# Patient Record
Sex: Female | Born: 1955 | Race: White | Hispanic: No | Smoking: Current every day smoker
Health system: Southern US, Community
[De-identification: ages and names within clinical notes are randomized; demographics above are authoritative.]

## PROBLEM LIST (undated history)

## (undated) DIAGNOSIS — I509 Heart failure, unspecified: Secondary | ICD-10-CM

## (undated) DIAGNOSIS — G8929 Other chronic pain: Secondary | ICD-10-CM

## (undated) DIAGNOSIS — M549 Dorsalgia, unspecified: Secondary | ICD-10-CM

## (undated) DIAGNOSIS — C801 Malignant (primary) neoplasm, unspecified: Secondary | ICD-10-CM

## (undated) DIAGNOSIS — R001 Bradycardia, unspecified: Secondary | ICD-10-CM

## (undated) DIAGNOSIS — F419 Anxiety disorder, unspecified: Secondary | ICD-10-CM

## (undated) DIAGNOSIS — R569 Unspecified convulsions: Secondary | ICD-10-CM

## (undated) DIAGNOSIS — E079 Disorder of thyroid, unspecified: Secondary | ICD-10-CM

## (undated) HISTORY — PX: BACK SURGERY: SHX140

## (undated) HISTORY — PX: MASTECTOMY: SHX3

## (undated) HISTORY — PX: ABDOMINAL HYSTERECTOMY: SHX81

---

## 2013-12-07 ENCOUNTER — Emergency Department (HOSPITAL_COMMUNITY): Payer: Medicare PPO

## 2013-12-07 ENCOUNTER — Observation Stay (HOSPITAL_COMMUNITY)
Admission: EM | Admit: 2013-12-07 | Discharge: 2013-12-09 | Disposition: A | Discharge status: Home / Self Care | Payer: Medicare PPO | Attending: Internal Medicine | Admitting: Internal Medicine

## 2013-12-07 ENCOUNTER — Encounter (HOSPITAL_COMMUNITY): Payer: Self-pay | Admitting: Emergency Medicine

## 2013-12-07 DIAGNOSIS — E876 Hypokalemia: Secondary | ICD-10-CM | POA: Insufficient documentation

## 2013-12-07 DIAGNOSIS — F419 Anxiety disorder, unspecified: Secondary | ICD-10-CM | POA: Diagnosis present

## 2013-12-07 DIAGNOSIS — G894 Chronic pain syndrome: Secondary | ICD-10-CM | POA: Insufficient documentation

## 2013-12-07 DIAGNOSIS — R569 Unspecified convulsions: Secondary | ICD-10-CM | POA: Diagnosis present

## 2013-12-07 DIAGNOSIS — D72829 Elevated white blood cell count, unspecified: Secondary | ICD-10-CM | POA: Insufficient documentation

## 2013-12-07 DIAGNOSIS — G9340 Encephalopathy, unspecified: Secondary | ICD-10-CM | POA: Diagnosis present

## 2013-12-07 DIAGNOSIS — F29 Unspecified psychosis not due to a substance or known physiological condition: Secondary | ICD-10-CM | POA: Insufficient documentation

## 2013-12-07 DIAGNOSIS — I498 Other specified cardiac arrhythmias: Secondary | ICD-10-CM | POA: Insufficient documentation

## 2013-12-07 DIAGNOSIS — Z9010 Acquired absence of unspecified breast and nipple: Secondary | ICD-10-CM | POA: Insufficient documentation

## 2013-12-07 DIAGNOSIS — R001 Bradycardia, unspecified: Secondary | ICD-10-CM | POA: Diagnosis present

## 2013-12-07 DIAGNOSIS — Z79899 Other long term (current) drug therapy: Secondary | ICD-10-CM | POA: Insufficient documentation

## 2013-12-07 DIAGNOSIS — Z853 Personal history of malignant neoplasm of breast: Secondary | ICD-10-CM

## 2013-12-07 DIAGNOSIS — I509 Heart failure, unspecified: Secondary | ICD-10-CM | POA: Insufficient documentation

## 2013-12-07 DIAGNOSIS — M549 Dorsalgia, unspecified: Secondary | ICD-10-CM | POA: Insufficient documentation

## 2013-12-07 DIAGNOSIS — Z23 Encounter for immunization: Secondary | ICD-10-CM | POA: Insufficient documentation

## 2013-12-07 DIAGNOSIS — F411 Generalized anxiety disorder: Secondary | ICD-10-CM | POA: Insufficient documentation

## 2013-12-07 DIAGNOSIS — E079 Disorder of thyroid, unspecified: Secondary | ICD-10-CM | POA: Insufficient documentation

## 2013-12-07 DIAGNOSIS — F17200 Nicotine dependence, unspecified, uncomplicated: Secondary | ICD-10-CM | POA: Insufficient documentation

## 2013-12-07 DIAGNOSIS — G8929 Other chronic pain: Secondary | ICD-10-CM | POA: Diagnosis present

## 2013-12-07 DIAGNOSIS — R4182 Altered mental status, unspecified: Principal | ICD-10-CM | POA: Insufficient documentation

## 2013-12-07 HISTORY — DX: Disorder of thyroid, unspecified: E07.9

## 2013-12-07 HISTORY — DX: Anxiety disorder, unspecified: F41.9

## 2013-12-07 HISTORY — DX: Unspecified convulsions: R56.9

## 2013-12-07 HISTORY — DX: Other chronic pain: G89.29

## 2013-12-07 HISTORY — DX: Heart failure, unspecified: I50.9

## 2013-12-07 HISTORY — DX: Bradycardia, unspecified: R00.1

## 2013-12-07 HISTORY — DX: Malignant (primary) neoplasm, unspecified: C80.1

## 2013-12-07 HISTORY — DX: Dorsalgia, unspecified: M54.9

## 2013-12-07 LAB — RAPID URINE DRUG SCREEN, HOSP PERFORMED
Amphetamines: NOT DETECTED
Barbiturates: NOT DETECTED
Benzodiazepines: NOT DETECTED
COCAINE: NOT DETECTED
Opiates: NOT DETECTED
Tetrahydrocannabinol: NOT DETECTED

## 2013-12-07 LAB — CBC WITH DIFFERENTIAL/PLATELET
Basophils Absolute: 0 10*3/uL (ref 0.0–0.1)
Basophils Relative: 0 % (ref 0–1)
EOS ABS: 0 10*3/uL (ref 0.0–0.7)
Eosinophils Relative: 0 % (ref 0–5)
HEMATOCRIT: 44.6 % (ref 36.0–46.0)
Hemoglobin: 14.8 g/dL (ref 12.0–15.0)
Lymphocytes Relative: 19 % (ref 12–46)
Lymphs Abs: 2.1 10*3/uL (ref 0.7–4.0)
MCH: 27.7 pg (ref 26.0–34.0)
MCHC: 33.2 g/dL (ref 30.0–36.0)
MCV: 83.5 fL (ref 78.0–100.0)
Monocytes Absolute: 0.8 10*3/uL (ref 0.1–1.0)
Monocytes Relative: 7 % (ref 3–12)
Neutro Abs: 7.8 10*3/uL — ABNORMAL HIGH (ref 1.7–7.7)
Neutrophils Relative %: 73 % (ref 43–77)
Platelets: 380 10*3/uL (ref 150–400)
RBC: 5.34 MIL/uL — AB (ref 3.87–5.11)
RDW: 14.8 % (ref 11.5–15.5)
WBC: 10.7 10*3/uL — ABNORMAL HIGH (ref 4.0–10.5)

## 2013-12-07 LAB — URINALYSIS, ROUTINE W REFLEX MICROSCOPIC
Bilirubin Urine: NEGATIVE
GLUCOSE, UA: NEGATIVE mg/dL
Hgb urine dipstick: NEGATIVE
Ketones, ur: 15 mg/dL — AB
LEUKOCYTES UA: NEGATIVE
NITRITE: NEGATIVE
PH: 6 (ref 5.0–8.0)
PROTEIN: NEGATIVE mg/dL
Specific Gravity, Urine: 1.025 (ref 1.005–1.030)
Urobilinogen, UA: 1 mg/dL (ref 0.0–1.0)

## 2013-12-07 LAB — BASIC METABOLIC PANEL
BUN: 12 mg/dL (ref 6–23)
CO2: 22 meq/L (ref 19–32)
Calcium: 9.8 mg/dL (ref 8.4–10.5)
Chloride: 102 mEq/L (ref 96–112)
Creatinine, Ser: 0.77 mg/dL (ref 0.50–1.10)
GFR calc Af Amer: >90 mL/min (ref >90)
Glucose, Bld: 109 mg/dL — ABNORMAL HIGH (ref 70–99)
POTASSIUM: 3.7 meq/L (ref 3.7–5.3)
Sodium: 142 mEq/L (ref 137–147)

## 2013-12-07 LAB — TROPONIN I: Troponin I: <0.3 ng/mL (ref <0.30)

## 2013-12-07 NOTE — ED Provider Notes (Signed)
CSN: [id number]     Arrival date & time 12/07/13  1750 History  This chart was scribed for Jamie Greek, MD by Jamie Nunez, ED Scribe. This patient was seen in room APA05/APA05 and the patient's care was started at 5:58 PM.  Chief Complaint  Patient presents with  . Altered Mental Status    The history is provided by the patient. No language interpreter was used.    HPI Comments: Jamie Nunez is a 58 y.o. Female brought by EMS to the Emergency Department complaining of mild confusion currently after an episode of witnessed seizure-like activity that occurred PTA. Husband states that pt suddenly began feeling confused, and "not feeling well" in general while they were on a short car trip PTA. Husband reports that pt then began having very brief "head jerking episodes" intermittently for about 5 minutes. Husband reports that pt's eyes rolled back in her head and that her mouth "looked funny" at this time. Husband believes this may have been seizure-like activity. Pt is repeatedly stating "I don't know what happened" and I just know I didn't feel well". Pt states that she has no prior history of similar symptoms. Husband states that he believes pt had decreased responsiveness during this episode today. Pt denies LOC, feeling lightheaded or any other symptoms.    Past Medical History  Diagnosis Date  . CHF (congestive heart failure)   . Cancer     left breast  . Thyroid disease    Past Surgical History  Procedure Laterality Date  . Back surgery    . Mastectomy Left   . Abdominal hysterectomy     History reviewed. No pertinent family history. History  Substance Use Topics  . Smoking status: Current Every Day Smoker  . Smokeless tobacco: Not on file  . Alcohol Use: No   OB History   Grav Para Term Preterm Abortions TAB SAB Ect Mult Living                 Review of Systems  Neurological: Negative for syncope and light-headedness.       Possible seizure   Psychiatric/Behavioral: Positive for confusion.  All other systems reviewed and are negative.   Allergies  Review of patient's allergies indicates no known allergies.  Home Medications   Current Outpatient Rx  Name  Route  Sig  Dispense  Refill  . ALPRAZolam (XANAX) 0.5 MG tablet   Oral   Take 0.5 mg by mouth 2 (two) times daily as needed.         . furosemide (LASIX) 20 MG tablet   Oral   Take 10 mg by mouth 3 (three) times daily.         . methadone (DOLOPHINE) 10 MG tablet   Oral   Take 10 mg by mouth 3 (three) times daily.         Marland Kitchen zolpidem (AMBIEN) 10 MG tablet   Oral   Take 10 mg by mouth at bedtime as needed.           Triage Vitals: BP 146/63  Pulse 112  Temp(Src) 99.3 F (37.4 C) (Oral)  Resp 20  Ht 5\' 1"  (1.549 m)  Wt 156 lb (70.761 kg)  BMI 29.49 kg/m2  SpO2 93%  Physical Exam  Constitutional: She is oriented to person, place, and time. She appears well-developed and well-nourished. No distress.  HENT:  Head: Normocephalic and atraumatic.  Right Ear: Hearing normal.  Left Ear: Hearing normal.  Nose: Nose  normal.  Mouth/Throat: Oropharynx is clear and moist and mucous membranes are normal.  Eyes: Conjunctivae and EOM are normal. Pupils are equal, round, and reactive to light.  Neck: Normal range of motion. Neck supple.  Cardiovascular: Regular rhythm, S1 normal and S2 normal.  Exam reveals no gallop and no friction rub.   No murmur heard. Pulmonary/Chest: Effort normal and breath sounds normal. No respiratory distress. She exhibits no tenderness.  Abdominal: Soft. Normal appearance and bowel sounds are normal. There is no hepatosplenomegaly. There is no tenderness. There is no rebound, no guarding, no tenderness at McBurney's point and negative Murphy's sign. No hernia.  Musculoskeletal: Normal range of motion.  Neurological: She is alert and oriented to person, place, and time. She has normal strength. No cranial nerve deficit or sensory  deficit. Coordination normal. GCS eye subscore is 4. GCS verbal subscore is 5. GCS motor subscore is 6.  Slow to respond to questions and was unable to answer some questions by her significant other.  Skin: Skin is warm, dry and intact. No rash noted. No cyanosis.  Psychiatric: She has a normal mood and affect. Her speech is normal and behavior is normal. Thought content normal.    ED Course  Procedures (including critical care time)  DIAGNOSTIC STUDIES: Oxygen Saturation is 93% on RA, normal by my interpretation.    COORDINATION OF CARE: 6:05 PM- Discussed plan to obtain a CT of pt's head, as well as diagnostic lab work. Pt advised of plan for treatment and pt agrees.  Labs Review Labs Reviewed  CBC WITH DIFFERENTIAL - Abnormal; Notable for the following:    WBC 10.7 (*)    RBC 5.34 (*)    Neutro Abs 7.8 (*)    All other components within normal limits  BASIC METABOLIC PANEL - Abnormal; Notable for the following:    Glucose, Bld 109 (*)    All other components within normal limits  URINALYSIS, ROUTINE W REFLEX MICROSCOPIC - Abnormal; Notable for the following:    Ketones, ur 15 (*)    All other components within normal limits  TROPONIN I   Imaging Review Ct Head Wo Contrast  12/07/2013   CLINICAL DATA:  Confusion.  Questionable seizure activity.  EXAM: CT HEAD WITHOUT CONTRAST  TECHNIQUE: Contiguous axial images were obtained from the base of the skull through the vertex without intravenous contrast.  COMPARISON:  None.  FINDINGS: Slight motion degradation.  No intracranial hemorrhage.  No CT evidence of large acute infarct.  No intracranial mass lesion noted on this unenhanced exam. If further delineation for seizure focus or small acute infarct is clinically desired, MR may be considered  No hydrocephalus.  IMPRESSION: No intracranial hemorrhage or CT evidence of large acute infarct.   Electronically Signed   By: Chauncey Cruel M.D.   On: 12/07/2013 18:36    EKG Interpretation     Date/Time:  Tuesday December 07 2013 18:57:45 EST Ventricular Rate:  64 PR Interval:  176 QRS Duration: 96 QT Interval:  458 QTC Calculation: 472 R Axis:   -27 Text Interpretation:  Normal sinus rhythm Low voltage QRS Incomplete right bundle branch block Possible Inferior infarct , age undetermined Cannot rule out Anterior infarct , age undetermined Abnormal ECG No previous ECGs available Confirmed by Doretta Remmert  MD, Tarrie Mcmichen ([id number]) on 12/07/2013 7:11:40 PM            MDM  Diagnosis: Mental status changes, seizure versus TIA  Patient comes to the ER for evaluation of acute  onset mental status changes confusion. Significant other reports that she had shaking movements of her extremities when episode occurred. Initially she was responding while she was shaking her arms, but then she went completely unresponsive, continued to have shaking episodes. This is concerning for possible seizure. There are some atypical features for seizure, in that initially the shaking episodes were while she was alert and she remembers doing it. On arrival to the ER, her mentation was improved, but she was still slow to respond and unable to answer simple questions. She was initially disoriented for EMS. This could be post ictal state. I cannot rule out TIA. As the patient has never had a similar episode, will ask hospitalist to observe her overnight for seizure versus TIA.   I personally performed the services described in this documentation, which was scribed in my presence. The recorded information has been reviewed and is accurate.    Jamie Greek, MD 12/07/13 2046

## 2013-12-07 NOTE — ED Notes (Signed)
Blood pressure cuff moved to right arm. Patient had left mastectomy 2 years ago.

## 2013-12-07 NOTE — ED Notes (Signed)
Per EMS, called out for possible seizure EMS states when they arrived pt was confused and unable to say where she lived. On arrival to ED pt appeared a little confused. Pt states she and her friend were on the way to [facility] to eat. States she thought she was still in [place]

## 2013-12-08 ENCOUNTER — Observation Stay (HOSPITAL_COMMUNITY): Payer: Medicare PPO

## 2013-12-08 ENCOUNTER — Observation Stay (HOSPITAL_COMMUNITY)
Admit: 2013-12-08 | Discharge: 2013-12-08 | Disposition: A | Discharge status: Home / Self Care | Payer: Medicare PPO | Attending: Internal Medicine | Admitting: Internal Medicine

## 2013-12-08 ENCOUNTER — Encounter (HOSPITAL_COMMUNITY): Payer: Self-pay | Admitting: Internal Medicine

## 2013-12-08 DIAGNOSIS — R001 Bradycardia, unspecified: Secondary | ICD-10-CM

## 2013-12-08 DIAGNOSIS — G8929 Other chronic pain: Secondary | ICD-10-CM | POA: Diagnosis present

## 2013-12-08 DIAGNOSIS — R569 Unspecified convulsions: Secondary | ICD-10-CM

## 2013-12-08 DIAGNOSIS — F411 Generalized anxiety disorder: Secondary | ICD-10-CM

## 2013-12-08 DIAGNOSIS — M549 Dorsalgia, unspecified: Secondary | ICD-10-CM

## 2013-12-08 DIAGNOSIS — F419 Anxiety disorder, unspecified: Secondary | ICD-10-CM | POA: Diagnosis present

## 2013-12-08 DIAGNOSIS — Z853 Personal history of malignant neoplasm of breast: Secondary | ICD-10-CM

## 2013-12-08 DIAGNOSIS — I498 Other specified cardiac arrhythmias: Secondary | ICD-10-CM

## 2013-12-08 HISTORY — DX: Other chronic pain: G89.29

## 2013-12-08 HISTORY — DX: Unspecified convulsions: R56.9

## 2013-12-08 HISTORY — DX: Anxiety disorder, unspecified: F41.9

## 2013-12-08 HISTORY — DX: Bradycardia, unspecified: R00.1

## 2013-12-08 LAB — MAGNESIUM: MAGNESIUM: 2.2 mg/dL (ref 1.5–2.5)

## 2013-12-08 LAB — COMPREHENSIVE METABOLIC PANEL
ALBUMIN: 3.9 g/dL (ref 3.5–5.2)
ALT: 19 U/L (ref 0–35)
AST: 17 U/L (ref 0–37)
Alkaline Phosphatase: 99 U/L (ref 39–117)
BUN: 9 mg/dL (ref 6–23)
CALCIUM: 9.3 mg/dL (ref 8.4–10.5)
CO2: 23 mEq/L (ref 19–32)
Chloride: 105 mEq/L (ref 96–112)
Creatinine, Ser: 0.71 mg/dL (ref 0.50–1.10)
GFR calc non Af Amer: >90 mL/min (ref >90)
GLUCOSE: 82 mg/dL (ref 70–99)
Potassium: 3.4 mEq/L — ABNORMAL LOW (ref 3.7–5.3)
Sodium: 144 mEq/L (ref 137–147)
TOTAL PROTEIN: 7.5 g/dL (ref 6.0–8.3)
Total Bilirubin: 0.8 mg/dL (ref 0.3–1.2)

## 2013-12-08 LAB — CBC
HEMATOCRIT: 44.5 % (ref 36.0–46.0)
HEMOGLOBIN: 14.9 g/dL (ref 12.0–15.0)
MCH: 28.1 pg (ref 26.0–34.0)
MCHC: 33.5 g/dL (ref 30.0–36.0)
MCV: 83.8 fL (ref 78.0–100.0)
Platelets: 380 10*3/uL (ref 150–400)
RBC: 5.31 MIL/uL — AB (ref 3.87–5.11)
RDW: 14.9 % (ref 11.5–15.5)
WBC: 16.8 10*3/uL — ABNORMAL HIGH (ref 4.0–10.5)

## 2013-12-08 LAB — HEPATIC FUNCTION PANEL
ALT: 21 U/L (ref 0–35)
AST: 22 U/L (ref 0–37)
Albumin: 4.3 g/dL (ref 3.5–5.2)
Alkaline Phosphatase: 107 U/L (ref 39–117)
BILIRUBIN TOTAL: 0.7 mg/dL (ref 0.3–1.2)
Bilirubin, Direct: <0.2 mg/dL (ref 0.0–0.3)
Total Protein: 8 g/dL (ref 6.0–8.3)

## 2013-12-08 LAB — TSH: TSH: 3.611 u[IU]/mL (ref 0.350–4.500)

## 2013-12-08 MED ORDER — FUROSEMIDE 20 MG PO TABS
10.0000 mg | ORAL_TABLET | Freq: Two times a day (BID) | ORAL | Status: DC
  Administered 2013-12-08 – 2013-12-09 (×4): 10 mg via ORAL
  Filled 2013-12-08 (×4): qty 1

## 2013-12-08 MED ORDER — ONDANSETRON HCL 4 MG/2ML IJ SOLN: 4.0000 mg | Freq: Four times a day (QID) | INTRAMUSCULAR | Status: DC | PRN

## 2013-12-08 MED ORDER — ALPRAZOLAM 0.5 MG PO TABS
0.5000 mg | ORAL_TABLET | Freq: Two times a day (BID) | ORAL | Status: DC | PRN
  Administered 2013-12-08 – 2013-12-09 (×2): 0.5 mg via ORAL
  Filled 2013-12-08 (×2): qty 1

## 2013-12-08 MED ORDER — ONDANSETRON HCL 4 MG PO TABS: 4.0000 mg | ORAL_TABLET | Freq: Four times a day (QID) | ORAL | Status: DC | PRN

## 2013-12-08 MED ORDER — ACETAMINOPHEN 650 MG RE SUPP: 650.0000 mg | Freq: Four times a day (QID) | RECTAL | Status: DC | PRN

## 2013-12-08 MED ORDER — ACETAMINOPHEN 325 MG PO TABS: 650.0000 mg | ORAL_TABLET | Freq: Four times a day (QID) | ORAL | Status: DC | PRN

## 2013-12-08 MED ORDER — INFLUENZA VAC SPLIT QUAD 0.5 ML IM SUSP
0.5000 mL | INTRAMUSCULAR | Status: AC
  Administered 2013-12-09: 0.5 mL via INTRAMUSCULAR
  Filled 2013-12-08: qty 0.5

## 2013-12-08 MED ORDER — ZOLPIDEM TARTRATE 5 MG PO TABS
10.0000 mg | ORAL_TABLET | Freq: Every evening | ORAL | Status: DC | PRN
  Administered 2013-12-08: 10 mg via ORAL
  Filled 2013-12-08: qty 2

## 2013-12-08 MED ORDER — SODIUM CHLORIDE 0.9 % IJ SOLN
3.0000 mL | Freq: Two times a day (BID) | INTRAMUSCULAR | Status: DC
  Administered 2013-12-08 – 2013-12-09 (×4): 3 mL via INTRAVENOUS

## 2013-12-08 MED ORDER — GADOBENATE DIMEGLUMINE 529 MG/ML IV SOLN: 15.0000 mL | Freq: Once | INTRAVENOUS | Status: AC | PRN

## 2013-12-08 MED ORDER — THIAMINE HCL 100 MG/ML IJ SOLN
100.0000 mg | Freq: Every day | INTRAMUSCULAR | Status: DC
  Administered 2013-12-08 – 2013-12-09 (×2): 100 mg via INTRAVENOUS
  Filled 2013-12-08 (×2): qty 2

## 2013-12-08 MED ORDER — ALBUTEROL SULFATE (2.5 MG/3ML) 0.083% IN NEBU: 2.5000 mg | INHALATION_SOLUTION | RESPIRATORY_TRACT | Status: DC | PRN

## 2013-12-08 MED ORDER — POTASSIUM CHLORIDE CRYS ER 20 MEQ PO TBCR
30.0000 meq | EXTENDED_RELEASE_TABLET | Freq: Two times a day (BID) | ORAL | Status: AC
  Administered 2013-12-08 (×2): 30 meq via ORAL
  Filled 2013-12-08 (×4): qty 1

## 2013-12-08 MED ORDER — ENOXAPARIN SODIUM 40 MG/0.4ML ~~LOC~~ SOLN
40.0000 mg | SUBCUTANEOUS | Status: DC
  Administered 2013-12-08 – 2013-12-09 (×2): 40 mg via SUBCUTANEOUS
  Filled 2013-12-08 (×2): qty 0.4

## 2013-12-08 MED ORDER — SODIUM CHLORIDE 0.9 % IV SOLN
INTRAVENOUS | Status: AC
  Administered 2013-12-08 (×2): via INTRAVENOUS

## 2013-12-08 MED ORDER — METHADONE HCL 10 MG PO TABS
10.0000 mg | ORAL_TABLET | Freq: Three times a day (TID) | ORAL | Status: DC
  Administered 2013-12-08 (×3): 10 mg via ORAL
  Filled 2013-12-08 (×3): qty 1

## 2013-12-08 NOTE — Consult Note (Signed)
[place] A. Merlene Laughter, MD     [email or web address]          Jamie Nunez is an 58 y.o. female.   ASSESSMENT/PLAN: 1. The semiology of the patient's spell seems most consistent with a seizure. An ischemic event is also a possibility.However, I think this is less likely. The patient has History of breast cancers increases the risk of seizures. No metabolic derangements have been encouraged to explain the patient's seizures. There is also possibility that the patient may have seizures due to benzodiazepine withdrawal although she reports not taking her Alprazolam every day. The urine toxicology results of that have been negative for benzodiazepines however. I agree patient should not be placed on seizure medications at this time. EEG and MRI pending. We'll continue to follow patient.  This is a 58 year old white female who came acutely confused while driving to [place]. She was with her significant other. She became confused and disoriented and probably started having shaking events. The patient is mostly amnestic to the event. She does not report remembering coming to the emergency room by EMS. She also does not remember eating in the ER. She does report however that she did become confused and disoriented. She does not report having focal numbness or weakness. She does complain of some headaches but reports that she has a long history of headaches. No dysarthria or dysphasia as reported. There is no chest pain or shortness of breath. She does not remember having urinary or bowel incontinence. There is no oral trauma. There is no history of seizures. The patient has a history of breast cancer treated at [facility] approximately 3 years ago. She does not report any changes in her medications. The workup in the ER has been mostly unrevealing for any metabolic derangements or abnormal findings on CT scan. Her urine toxicology test results is negative for prescribed medications but she  reports taking the Xanax on most days but not every day. The specimen is negative for methadone but the urine screen does not pick up methadone. The review of systems is otherwise negative other than stated above.  GENERAL: This is very pleasant female in no acute distress.  HEENT: Supple. Atraumatic normocephalic.   ABDOMEN: soft  EXTREMITIES: No edema   BACK: Normal.  SKIN: Normal by inspection.    MENTAL STATUS: Alert and oriented. Speech, language and cognition are generally intact. Judgment and insight normal.   CRANIAL NERVES: Pupils are equal, round and reactive to light and accommodation; extra ocular movements are full, there is no significant nystagmus; visual fields are full; upper and lower facial muscles are normal in strength and symmetric, there is no flattening of the nasolabial folds; tongue is midline; uvula is midline; shoulder elevation is normal.  MOTOR: Normal tone, bulk and strength; no pronator drift.  COORDINATION: Left finger to nose is normal, right finger to nose is normal, No rest tremor; no intention tremor; no postural tremor; no bradykinesia.  REFLEXES: Deep tendon reflexes are symmetrical and normal. Babinski reflexes are flexor bilaterally.   SENSATION: Normal to light touch.      Past Medical History  Diagnosis Date  . CHF (congestive heart failure)   . Cancer     left breast  . Thyroid disease     Past Surgical History  Procedure Laterality Date  . Back surgery    . Mastectomy Left   . Abdominal hysterectomy      History reviewed. No pertinent family history.  Social History:  reports  that she has been smoking.  She does not have any smokeless tobacco history on file. She reports that she does not drink alcohol or use illicit drugs.  Allergies: No Known Allergies  Medications: Prior to Admission medications   Medication Sig Start Date End Date Taking? Authorizing Provider  ALPRAZolam Duanne Moron) 0.5 MG tablet Take 0.5 mg by mouth 2  (two) times daily as needed. 11/26/13  Yes Historical Provider, MD  furosemide (LASIX) 20 MG tablet Take 10 mg by mouth 3 (three) times daily.   Yes Historical Provider, MD  methadone (DOLOPHINE) 10 MG tablet Take 10 mg by mouth 3 (three) times daily. 12/06/13  Yes Historical Provider, MD  zolpidem (AMBIEN) 10 MG tablet Take 10 mg by mouth at bedtime as needed. 11/23/13  Yes Historical Provider, MD    Scheduled Meds: . enoxaparin (LOVENOX) injection  40 mg Subcutaneous Q24H  . furosemide  10 mg Oral BID  . [START ON 12/09/2013] influenza vac split quadrivalent PF  0.5 mL Intramuscular Tomorrow-1000  . methadone  10 mg Oral Q8H  . sodium chloride  3 mL Intravenous Q12H  . thiamine  100 mg Intravenous Daily   Continuous Infusions: . sodium chloride 75 mL/hr at 12/08/13 0244   PRN Meds:.acetaminophen, acetaminophen, albuterol, ALPRAZolam, ondansetron (ZOFRAN) IV, ondansetron   Blood pressure 108/43, pulse 56, temperature 98.8 F (37.1 C), temperature source Oral, resp. rate 10, height 5' 1" (1.549 m), weight 87.2 kg (192 lb 3.9 oz), SpO2 99.00%.   Results for orders placed during the hospital encounter of 12/07/13 (from the past 48 hour(s))  CBC WITH DIFFERENTIAL     Status: Abnormal   Collection Time    12/07/13  6:12 PM      Result Value Range   WBC 10.7 (*) 4.0 - 10.5 K/uL   RBC 5.34 (*) 3.87 - 5.11 MIL/uL   Hemoglobin 14.8  12.0 - 15.0 g/dL   HCT 44.6  36.0 - 46.0 %   MCV 83.5  78.0 - 100.0 fL   MCH 27.7  26.0 - 34.0 pg   MCHC 33.2  30.0 - 36.0 g/dL   RDW 14.8  11.5 - 15.5 %   Platelets 380  150 - 400 K/uL   Neutrophils Relative % 73  43 - 77 %   Neutro Abs 7.8 (*) 1.7 - 7.7 K/uL   Lymphocytes Relative 19  12 - 46 %   Lymphs Abs 2.1  0.7 - 4.0 K/uL   Monocytes Relative 7  3 - 12 %   Monocytes Absolute 0.8  0.1 - 1.0 K/uL   Eosinophils Relative 0  0 - 5 %   Eosinophils Absolute 0.0  0.0 - 0.7 K/uL   Basophils Relative 0  0 - 1 %   Basophils Absolute 0.0  0.0 - 0.1 K/uL    BASIC METABOLIC PANEL     Status: Abnormal   Collection Time    12/07/13  6:12 PM      Result Value Range   Sodium 142  137 - 147 mEq/L   Potassium 3.7  3.7 - 5.3 mEq/L   Chloride 102  96 - 112 mEq/L   CO2 22  19 - 32 mEq/L   Glucose, Bld 109 (*) 70 - 99 mg/dL   BUN 12  6 - 23 mg/dL   Creatinine, Ser 0.77  0.50 - 1.10 mg/dL   Calcium 9.8  8.4 - 10.5 mg/dL   GFR calc non Af Amer >90  >90 mL/min  GFR calc Af Amer >90  >90 mL/min   Comment: (NOTE)     The eGFR has been calculated using the CKD EPI equation.     This calculation has not been validated in all clinical situations.     eGFR's persistently <90 mL/min signify possible Chronic Kidney     Disease.  TROPONIN I     Status: None   Collection Time    12/07/13  6:12 PM      Result Value Range   Troponin I <0.30  <0.30 ng/mL   Comment:            Due to the release kinetics of cTnI,     a negative result within the first hours     of the onset of symptoms does not rule out     myocardial infarction with certainty.     If myocardial infarction is still suspected,     repeat the test at appropriate intervals.  URINALYSIS, ROUTINE W REFLEX MICROSCOPIC     Status: Abnormal   Collection Time    12/07/13  7:40 PM      Result Value Range   Color, Urine YELLOW  YELLOW   APPearance CLEAR  CLEAR   Specific Gravity, Urine 1.025  1.005 - 1.030   pH 6.0  5.0 - 8.0   Glucose, UA NEGATIVE  NEGATIVE mg/dL   Hgb urine dipstick NEGATIVE  NEGATIVE   Bilirubin Urine NEGATIVE  NEGATIVE   Ketones, ur 15 (*) NEGATIVE mg/dL   Protein, ur NEGATIVE  NEGATIVE mg/dL   Urobilinogen, UA 1.0  0.0 - 1.0 mg/dL   Nitrite NEGATIVE  NEGATIVE   Leukocytes, UA NEGATIVE  NEGATIVE   Comment: MICROSCOPIC NOT DONE ON URINES WITH NEGATIVE PROTEIN, BLOOD, LEUKOCYTES, NITRITE, OR GLUCOSE <1000 mg/dL.  URINE RAPID DRUG SCREEN (HOSP PERFORMED)     Status: None   Collection Time    12/07/13  9:07 PM      Result Value Range   Opiates NONE DETECTED  NONE  DETECTED   Cocaine NONE DETECTED  NONE DETECTED   Benzodiazepines NONE DETECTED  NONE DETECTED   Amphetamines NONE DETECTED  NONE DETECTED   Tetrahydrocannabinol NONE DETECTED  NONE DETECTED   Barbiturates NONE DETECTED  NONE DETECTED   Comment:            DRUG SCREEN FOR MEDICAL PURPOSES     ONLY.  IF CONFIRMATION IS NEEDED     FOR ANY PURPOSE, NOTIFY LAB     WITHIN 5 DAYS.                LOWEST DETECTABLE LIMITS     FOR URINE DRUG SCREEN     Drug Class       Cutoff (ng/mL)     Amphetamine      1000     Barbiturate      200     Benzodiazepine   373     Tricyclics       428     Opiates          300     Cocaine          300     THC              50  MAGNESIUM     Status: None   Collection Time    12/08/13  1:35 AM      Result Value Range   Magnesium 2.2  1.5 - 2.5  mg/dL  HEPATIC FUNCTION PANEL     Status: None   Collection Time    12/08/13  1:35 AM      Result Value Range   Total Protein 8.0  6.0 - 8.3 g/dL   Albumin 4.3  3.5 - 5.2 g/dL   AST 22  0 - 37 U/L   ALT 21  0 - 35 U/L   Alkaline Phosphatase 107  39 - 117 U/L   Total Bilirubin 0.7  0.3 - 1.2 mg/dL   Bilirubin, Direct <0.2  0.0 - 0.3 mg/dL   Indirect Bilirubin NOT CALCULATED  0.3 - 0.9 mg/dL  COMPREHENSIVE METABOLIC PANEL     Status: Abnormal   Collection Time    12/08/13  5:10 AM      Result Value Range   Sodium 144  137 - 147 mEq/L   Potassium 3.4 (*) 3.7 - 5.3 mEq/L   Chloride 105  96 - 112 mEq/L   CO2 23  19 - 32 mEq/L   Glucose, Bld 82  70 - 99 mg/dL   BUN 9  6 - 23 mg/dL   Creatinine, Ser 0.71  0.50 - 1.10 mg/dL   Calcium 9.3  8.4 - 10.5 mg/dL   Total Protein 7.5  6.0 - 8.3 g/dL   Albumin 3.9  3.5 - 5.2 g/dL   AST 17  0 - 37 U/L   ALT 19  0 - 35 U/L   Alkaline Phosphatase 99  39 - 117 U/L   Total Bilirubin 0.8  0.3 - 1.2 mg/dL   GFR calc non Af Amer >90  >90 mL/min   GFR calc Af Amer >90  >90 mL/min   Comment: (NOTE)     The eGFR has been calculated using the CKD EPI equation.     This  calculation has not been validated in all clinical situations.     eGFR's persistently <90 mL/min signify possible Chronic Kidney     Disease.  CBC     Status: Abnormal   Collection Time    12/08/13  5:10 AM      Result Value Range   WBC 16.8 (*) 4.0 - 10.5 K/uL   RBC 5.31 (*) 3.87 - 5.11 MIL/uL   Hemoglobin 14.9  12.0 - 15.0 g/dL   HCT 44.5  36.0 - 46.0 %   MCV 83.8  78.0 - 100.0 fL   MCH 28.1  26.0 - 34.0 pg   MCHC 33.5  30.0 - 36.0 g/dL   RDW 14.9  11.5 - 15.5 %   Platelets 380  150 - 400 K/uL    Ct Head Wo Contrast  12/07/2013   CLINICAL DATA:  Confusion.  Questionable seizure activity.  EXAM: CT HEAD WITHOUT CONTRAST  TECHNIQUE: Contiguous axial images were obtained from the base of the skull through the vertex without intravenous contrast.  COMPARISON:  None.  FINDINGS: Slight motion degradation.  No intracranial hemorrhage.  No CT evidence of large acute infarct.  No intracranial mass lesion noted on this unenhanced exam. If further delineation for seizure focus or small acute infarct is clinically desired, MR may be considered  No hydrocephalus.  IMPRESSION: No intracranial hemorrhage or CT evidence of large acute infarct.   Electronically Signed   By: Chauncey Cruel M.D.   On: 12/07/2013 18:36         A. Merlene Laughter, M.D.  Diplomate, [employer] adviser of Psychiatry and Neurology ( Neurology). 12/08/2013, 7:35 AM

## 2013-12-08 NOTE — Progress Notes (Signed)
EEG Completed; Results Pending  

## 2013-12-08 NOTE — Progress Notes (Signed)
Pt appears to be resting comfortably, VSS, pt c/o pain 6/10 (see pain assess), has scheduled methadone with 2200 med pass, pt takes Ambien 10mg  po QHS at home and it was not ordered here, text paged MD on call to get ordered as pt is asking for it now, low bed, HOB self regulated, call bell at pt's side, will cont to monitor

## 2013-12-08 NOTE — H&P (Signed)
[facility] History and Physical  Jamie Nunez DXA:[id number] DOB: 04-19-1956 DOA: 12/07/2013   PCP: Patient is unable to tell Specialists: Unknown  Chief Complaint: Confusion and possible seizure  HPI: Jamie Nunez is a 58 y.o. female with the past medical history of chronic pain in the back, history of breast cancer, status post mastectomy, who apparently was in her usual state of health earlier today when she had gone over to [place] to eat. When she was coming back with her boyfriend, apparently patient got confused and started having had jerking movements. These were concerning for seizure type activity. EMS was called and the patient was brought into the hospital. Patient still is confused. She denies any headaches or any other pain at this time. She tells me that she was feeling funny earlier, but unable to specify any further. She doesn't have any known history of seizures. Denies any recent trauma. No fever. No nausea, vomiting, or diarrhea. No recent medication changes. Denies stopping any of her medications abruptly.  Home Medications: Prior to Admission medications   Medication Sig Start Date End Date Taking? Authorizing Provider  ALPRAZolam Duanne Moron) 0.5 MG tablet Take 0.5 mg by mouth 2 (two) times daily as needed. 11/26/13  Yes Historical Provider, MD  furosemide (LASIX) 20 MG tablet Take 10 mg by mouth 3 (three) times daily.   Yes Historical Provider, MD  methadone (DOLOPHINE) 10 MG tablet Take 10 mg by mouth 3 (three) times daily. 12/06/13  Yes Historical Provider, MD  zolpidem (AMBIEN) 10 MG tablet Take 10 mg by mouth at bedtime as needed. 11/23/13  Yes Historical Provider, MD    Allergies: No Known Allergies  Past Medical History: Past Medical History  Diagnosis Date  . CHF (congestive heart failure)   . Cancer     left breast  . Thyroid disease     Past Surgical History  Procedure Laterality Date  . Back surgery    . Mastectomy Left   . Abdominal  hysterectomy      Social History: Patient lives in [place]. Her boyfriend lives next door. Smokes half-pack of cigarettes on a daily basis. No alcohol use. No illicit drug use. Usually independent with daily activities.  Family History: unable to obtain from this patient due to confusion  Review of Systems - unable to do due to patient's confusion  Physical Examination  Filed Vitals:   12/07/13 1806 12/07/13 1919 12/07/13 2200 12/07/13 2304  BP: 146/63 135/63 123/49 140/67  Pulse: 112 67 64 61  Temp: 99.3 F (37.4 C) 99 F (37.2 C) 98.2 F (36.8 C) 99.1 F (37.3 C)  TempSrc: Oral Oral Oral Oral  Resp: '20 16 20 17  ' Height: '5\' 1"'  (1.549 m)     Weight: 70.761 kg (156 lb)     SpO2: 93% 96% 94% 97%    General appearance: alert, cooperative, appears stated age, distracted and no distress Head: Normocephalic, without obvious abnormality, atraumatic Eyes: conjunctivae/corneas clear. PERRL, EOM's intact.  Throat: lips, mucosa, and tongue normal; teeth and gums normal Neck: no adenopathy, no carotid bruit, no JVD, supple, symmetrical, trachea midline and thyroid not enlarged, symmetric, no tenderness/mass/nodules Resp: clear to auscultation bilaterally Cardio: regular rate and rhythm, S1, S2 normal, no murmur, click, rub or gallop GI: soft, non-tender; bowel sounds normal; no masses,  no organomegaly Extremities: extremities normal, atraumatic, no cyanosis or edema Pulses: 2+ and symmetric Skin: Skin color, texture, turgor normal. No rashes or lesions Lymph nodes: Cervical, supraclavicular, and axillary nodes  normal. Neurologic: She is alert. She knew she was in breech for which she knew she was in the hospital. She thought was 2003. She thought it was January. She couldn't tell me the date. She could name the president. No cranial nerve deficits. Motor strength was equal, upper and lower extremity bilaterally. Gait was not assessed. No Pronator drift.  Laboratory Data: Results for  orders placed during the hospital encounter of 12/07/13 (from the past 48 hour(s))  CBC WITH DIFFERENTIAL     Status: Abnormal   Collection Time    12/07/13  6:12 PM      Result Value Range   WBC 10.7 (*) 4.0 - 10.5 K/uL   RBC 5.34 (*) 3.87 - 5.11 MIL/uL   Hemoglobin 14.8  12.0 - 15.0 g/dL   HCT 44.6  36.0 - 46.0 %   MCV 83.5  78.0 - 100.0 fL   MCH 27.7  26.0 - 34.0 pg   MCHC 33.2  30.0 - 36.0 g/dL   RDW 14.8  11.5 - 15.5 %   Platelets 380  150 - 400 K/uL   Neutrophils Relative % 73  43 - 77 %   Neutro Abs 7.8 (*) 1.7 - 7.7 K/uL   Lymphocytes Relative 19  12 - 46 %   Lymphs Abs 2.1  0.7 - 4.0 K/uL   Monocytes Relative 7  3 - 12 %   Monocytes Absolute 0.8  0.1 - 1.0 K/uL   Eosinophils Relative 0  0 - 5 %   Eosinophils Absolute 0.0  0.0 - 0.7 K/uL   Basophils Relative 0  0 - 1 %   Basophils Absolute 0.0  0.0 - 0.1 K/uL  BASIC METABOLIC PANEL     Status: Abnormal   Collection Time    12/07/13  6:12 PM      Result Value Range   Sodium 142  137 - 147 mEq/L   Potassium 3.7  3.7 - 5.3 mEq/L   Chloride 102  96 - 112 mEq/L   CO2 22  19 - 32 mEq/L   Glucose, Bld 109 (*) 70 - 99 mg/dL   BUN 12  6 - 23 mg/dL   Creatinine, Ser 0.77  0.50 - 1.10 mg/dL   Calcium 9.8  8.4 - 10.5 mg/dL   GFR calc non Af Amer >90  >90 mL/min   GFR calc Af Amer >90  >90 mL/min   Comment: (NOTE)     The eGFR has been calculated using the CKD EPI equation.     This calculation has not been validated in all clinical situations.     eGFR's persistently <90 mL/min signify possible Chronic Kidney     Disease.  TROPONIN I     Status: None   Collection Time    12/07/13  6:12 PM      Result Value Range   Troponin I <0.30  <0.30 ng/mL   Comment:            Due to the release kinetics of cTnI,     a negative result within the first hours     of the onset of symptoms does not rule out     myocardial infarction with certainty.     If myocardial infarction is still suspected,     repeat the test at appropriate  intervals.  URINALYSIS, ROUTINE W REFLEX MICROSCOPIC     Status: Abnormal   Collection Time    12/07/13  7:40 PM  Result Value Range   Color, Urine YELLOW  YELLOW   APPearance CLEAR  CLEAR   Specific Gravity, Urine 1.025  1.005 - 1.030   pH 6.0  5.0 - 8.0   Glucose, UA NEGATIVE  NEGATIVE mg/dL   Hgb urine dipstick NEGATIVE  NEGATIVE   Bilirubin Urine NEGATIVE  NEGATIVE   Ketones, ur 15 (*) NEGATIVE mg/dL   Protein, ur NEGATIVE  NEGATIVE mg/dL   Urobilinogen, UA 1.0  0.0 - 1.0 mg/dL   Nitrite NEGATIVE  NEGATIVE   Leukocytes, UA NEGATIVE  NEGATIVE   Comment: MICROSCOPIC NOT DONE ON URINES WITH NEGATIVE PROTEIN, BLOOD, LEUKOCYTES, NITRITE, OR GLUCOSE <1000 mg/dL.  URINE RAPID DRUG SCREEN (HOSP PERFORMED)     Status: None   Collection Time    12/07/13  9:07 PM      Result Value Range   Opiates NONE DETECTED  NONE DETECTED   Cocaine NONE DETECTED  NONE DETECTED   Benzodiazepines NONE DETECTED  NONE DETECTED   Amphetamines NONE DETECTED  NONE DETECTED   Tetrahydrocannabinol NONE DETECTED  NONE DETECTED   Barbiturates NONE DETECTED  NONE DETECTED   Comment:            DRUG SCREEN FOR MEDICAL PURPOSES     ONLY.  IF CONFIRMATION IS NEEDED     FOR ANY PURPOSE, NOTIFY LAB     WITHIN 5 DAYS.                LOWEST DETECTABLE LIMITS     FOR URINE DRUG SCREEN     Drug Class       Cutoff (ng/mL)     Amphetamine      1000     Barbiturate      200     Benzodiazepine   657     Tricyclics       846     Opiates          300     Cocaine          300     THC              50    Radiology Reports: Ct Head Wo Contrast  12/07/2013   CLINICAL DATA:  Confusion.  Questionable seizure activity.  EXAM: CT HEAD WITHOUT CONTRAST  TECHNIQUE: Contiguous axial images were obtained from the base of the skull through the vertex without intravenous contrast.  COMPARISON:  None.  FINDINGS: Slight motion degradation.  No intracranial hemorrhage.  No CT evidence of large acute infarct.  No intracranial mass  lesion noted on this unenhanced exam. If further delineation for seizure focus or small acute infarct is clinically desired, MR may be considered  No hydrocephalus.  IMPRESSION: No intracranial hemorrhage or CT evidence of large acute infarct.   Electronically Signed   By: Chauncey Cruel M.D.   On: 12/07/2013 18:36    Electrocardiogram: Sinus rhythm at 68 beats per minute. Normal axis. No Q waves, nonspecific T wave, changes. No concerning ST changes are noted.  Problem List  Principal Problem:   Acute encephalopathy Active Problems:   Chronic back pain   History of breast cancer   Assessment: This is a 58 year old, Caucasian female, who was brought in due to confusion and possible seizure type activity. This is definitely a possibility as the patient is confused and could be in a postictal state. No focal neurological deficits are noted, but TIAs also in the differential.  Plan: #1 acute encephalopathy  with possibility of seizure: We will admit the patient to telemetry. MRI will be ordered for tomorrow. Get neurology consultation. Hold off on antiepileptic treatment for now unless she has another seizure. Check magnesium level, check LFTs. Urine drug screen was unremarkable. UA did not show infection.  #2 chronic back pain: Continue with her home narcotic regimen.  #3 history of breast cancer status post left mastectomy a few years ago: This issue appears to be stable. She did not require chemotherapy or radiation. She is followed by an oncologist in the [place] area.  We will need to get medical records from her providers in [place], tomorrow.  EKG does show some abnormalities, but it is unclear if this is new or old. Troponin was normal and she denies chest pain. EKG will be repeated in the morning.  DVT Prophylaxis: Lovenox Code Status: Full code Family Communication: Discussed with the patient. No family at bedside  Disposition Plan: Observe to telemetry.   Further management  decisions will depend on results of further testing and patient's response to treatment.  [facility]  [facility] Pager [phone]  If 7PM-7AM, please contact night-coverage [email or web address] Password [facility]  12/08/2013, 12:23 AM

## 2013-12-08 NOTE — Progress Notes (Addendum)
[facility] PROGRESS NOTE  Jamie Nunez INO:[id number] DOB: 1956-10-24 DOA: 12/07/2013 PCP: No primary provider on file.    Code Status: Full code Family Communication: None available Disposition Plan: Discharged home when clinically appropriate.   Consultants:  Neurology, Dr. Merlene Laughter  Procedures:  EEG, pending.  Antibiotics:  None  HPI/Subjective: The patient says that she is feeling a little anxious and request Xanax. She denies headache, dizziness, chest pain, or shortness of breath currently.  Objective: Filed Vitals:   12/08/13 0900  BP:   Pulse: 57  Temp:   Resp: 15   temperature 98.3. Pulse 57. Respiratory rate 15. Blood pressure 126/79. Oxygen saturation 97% on room air.     Intake/Output Summary (Last 24 hours) at 12/08/13 1001 Last data filed at 12/08/13 0900  Gross per 24 hour  Intake    470 ml  Output      0 ml  Net    470 ml   Filed Weights   12/07/13 1806 12/08/13 0125  Weight: 70.761 kg (156 lb) 87.2 kg (192 lb 3.9 oz)    Exam:   General:  Pleasant alert 58 year old woman laying in bed, in no acute distress.  Cardiovascular: S1, S2, with a soft systolic murmur and bradycardia.  Respiratory: Clear to auscultation bilaterally.  Abdomen: Positive bowel sounds, soft, nontender, nondistended.  Musculoskeletal: No acute hot joints. Pedal pulses palpable. No pedal edema.  Neurologic/psychiatric: She is alert and oriented to the hospital, herself, a year. Cranial nerves II through XII are intact. Strength is globally 5 over 5 and symmetric. Sensation is grossly intact. Her affect is pleasant and non-tremulous. Her speech is clear.  Data Reviewed: Basic Metabolic Panel:  Recent Labs Lab 12/07/13 1812 12/08/13 0135 12/08/13 0510  NA 142  --  144  K 3.7  --  3.4*  CL 102  --  105  CO2 22  --  23  GLUCOSE 109*  --  82  BUN 12  --  9  CREATININE 0.77  --  0.71  CALCIUM 9.8  --  9.3  MG  --  2.2  --    Liver Function  Tests:  Recent Labs Lab 12/08/13 0135 12/08/13 0510  AST 22 17  ALT 21 19  ALKPHOS 107 99  BILITOT 0.7 0.8  PROT 8.0 7.5  ALBUMIN 4.3 3.9   No results found for this basename: LIPASE, AMYLASE,  in the last 168 hours No results found for this basename: AMMONIA,  in the last 168 hours CBC:  Recent Labs Lab 12/07/13 1812 12/08/13 0510  WBC 10.7* 16.8*  NEUTROABS 7.8*  --   HGB 14.8 14.9  HCT 44.6 44.5  MCV 83.5 83.8  PLT 380 380   Cardiac Enzymes:  Recent Labs Lab 12/07/13 1812  TROPONINI <0.30   BNP (last 3 results) No results found for this basename: PROBNP,  in the last 8760 hours CBG: No results found for this basename: GLUCAP,  in the last 168 hours  No results found for this or any previous visit (from the past 240 hour(s)).   Studies: Ct Head Wo Contrast  12/07/2013   CLINICAL DATA:  Confusion.  Questionable seizure activity.  EXAM: CT HEAD WITHOUT CONTRAST  TECHNIQUE: Contiguous axial images were obtained from the base of the skull through the vertex without intravenous contrast.  COMPARISON:  None.  FINDINGS: Slight motion degradation.  No intracranial hemorrhage.  No CT evidence of large acute infarct.  No intracranial mass lesion noted on this  unenhanced exam. If further delineation for seizure focus or small acute infarct is clinically desired, MR may be considered  No hydrocephalus.  IMPRESSION: No intracranial hemorrhage or CT evidence of large acute infarct.   Electronically Signed   By: Chauncey Cruel M.D.   On: 12/07/2013 18:36    Scheduled Meds: . enoxaparin (LOVENOX) injection  40 mg Subcutaneous Q24H  . furosemide  10 mg Oral BID  . [START ON 12/09/2013] influenza vac split quadrivalent PF  0.5 mL Intramuscular Tomorrow-1000  . methadone  10 mg Oral Q8H  . sodium chloride  3 mL Intravenous Q12H  . thiamine  100 mg Intravenous Daily   Continuous Infusions: . sodium chloride 75 mL/hr at 12/08/13 0900   Assessment:  Principal Problem:   Acute  encephalopathy Active Problems:   Seizure   Chronic back pain   History of breast cancer   Chronic anxiety   Bradycardia   1. Acute encephalopathy. Query post ictal delirium. She is currently alert and oriented. There are no focal neurological findings. Dr. Freddie Apley assessment noted and appreciated.  Seizure. Etiology uncertain except that benzodiazepine withdrawal is a possibility. The patient reported that she did not take Xanax every day, but only as needed. She has not taking Xanax in several days to when she needs it, she usually takes it 2-3 times daily. Urine drug screen noted for no benzodiazepines. Also urine drug screen noted for no opiates although she is taking methadone chronically (apparently the urine drug screen does not pick up methadone). EEG and MRI of the brain are pending.  Chronic anxiety. As above, she takes Xanax 2-3 times daily as needed.  Chronic pain syndrome/chronic back pain. She is treated with 3 times a day dosing of methadone; this is being continued. She is followed by pain clinic physician [facility] in [place].  Bradycardia. This could be secondary to methadone. Thyroid function will be assessed.  Borderline hypokalemia, likely secondary to chronic Lasix. Will replete/supplement with oral potassium chloride.  Leukocytosis, unspecified. Query secondary to demargination from possible seizure. Chest x-ray has been ordered for further evaluation. Her urinalysis is negative. She has no complaints of diarrhea or fever.    Plan: 1. Continue supportive treatment. We'll decrease the rate of IV fluids. 2. Continue methadone and when necessary Xanax per previous home dosing. 3. We'll check the results of the EEG and MRI of the brain when available. 4. Will assess her thyroid function with TSH. Order chest x-ray to evaluate leukocytosis.   Time spent: 40 minutes.    [facility] Pager [phone]. If 7PM-7AM, please  contact night-coverage at [email or web address], password [facility] 12/08/2013, 10:01 AM  LOS: 1 day

## 2013-12-09 DIAGNOSIS — I495 Sick sinus syndrome: Secondary | ICD-10-CM

## 2013-12-09 LAB — BASIC METABOLIC PANEL
BUN: 10 mg/dL (ref 6–23)
CALCIUM: 9.1 mg/dL (ref 8.4–10.5)
CO2: 25 mEq/L (ref 19–32)
Chloride: 105 mEq/L (ref 96–112)
Creatinine, Ser: 0.87 mg/dL (ref 0.50–1.10)
GFR, EST AFRICAN AMERICAN: 83 mL/min — AB (ref >90)
GFR, EST NON AFRICAN AMERICAN: 72 mL/min — AB (ref >90)
Glucose, Bld: 89 mg/dL (ref 70–99)
Potassium: 3.5 mEq/L — ABNORMAL LOW (ref 3.7–5.3)
SODIUM: 143 meq/L (ref 137–147)

## 2013-12-09 LAB — CBC
HCT: 44 % (ref 36.0–46.0)
Hemoglobin: 14.9 g/dL (ref 12.0–15.0)
MCH: 28.4 pg (ref 26.0–34.0)
MCHC: 33.9 g/dL (ref 30.0–36.0)
MCV: 84 fL (ref 78.0–100.0)
Platelets: 326 10*3/uL (ref 150–400)
RBC: 5.24 MIL/uL — ABNORMAL HIGH (ref 3.87–5.11)
RDW: 14.8 % (ref 11.5–15.5)
WBC: 14.8 10*3/uL — ABNORMAL HIGH (ref 4.0–10.5)

## 2013-12-09 MED ORDER — POTASSIUM CHLORIDE ER 10 MEQ PO TBCR: 20.0000 meq | EXTENDED_RELEASE_TABLET | Freq: Every day | ORAL | Status: AC

## 2013-12-09 MED ORDER — METHADONE HCL 10 MG PO TABS: 5.0000 mg | ORAL_TABLET | Freq: Two times a day (BID) | ORAL | Status: AC

## 2013-12-09 MED ORDER — METHADONE HCL 10 MG PO TABS
5.0000 mg | ORAL_TABLET | Freq: Three times a day (TID) | ORAL | Status: DC
  Administered 2013-12-09: 5 mg via ORAL
  Filled 2013-12-09: qty 1

## 2013-12-09 MED ORDER — TAMOXIFEN CITRATE 20 MG PO TABS: 20.0000 mg | ORAL_TABLET | Freq: Every day | ORAL | Status: AC

## 2013-12-09 MED ORDER — TAMOXIFEN CITRATE 10 MG PO TABS
20.0000 mg | ORAL_TABLET | Freq: Every day | ORAL | Status: DC
  Administered 2013-12-09: 20 mg via ORAL
  Filled 2013-12-09: qty 2

## 2013-12-09 NOTE — Discharge Summary (Signed)
Physician Discharge Summary  [name] [id number] DOB: [birth year] DOA: 12/07/2013  PCP: No primary provider on file. Pain physician, Dr. Sander Radon [phone]).  Admit date: 12/07/2013 Discharge date: 12/09/2013  Time spent: Greater than 30 minutes  Recommendations for Outpatient Follow-up:  1. As per my conversation with Dr. Sander Radon, the patient was instructed to decrease the dosing of her methadone to 5 mg every 12 hours (down from 10 mg every 8 hours) due to bradycardia.  2. She was instructed to take her medications per at the prescription directions. 3. 2-D echocardiogram pending at the time of discharge. The results will need to be followed up upon. I informed the patient that I would give her the results when they're available.  Discharge Diagnoses:  1. Seizure, possibly secondary to benzodiazepine withdrawal. 2. Acute encephalopathy, possibly from post ictal delirium. 3. 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 4. 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 5. Bradycardia with a heart rate decreasing to the upper 30s at times, likely secondary to methadone. 6. History of breast cancer. 7. Leukocytosis of unknown etiology, possibly stress de margination. 8. Mild hypokalemia. 9. Suspicion of misuse of benzodiazepine treatment.  Discharge Condition: Improved.  Diet recommendation: Heart healthy.  Filed Weights   12/07/13 1806 12/08/13 0125 12/09/13 0500  Weight: 70.761 kg (156 lb) 87.2 kg (192 lb 3.9 oz) 86.2 kg (190 lb 0.6 oz)    History of present illness:   Jamie Nunez is a 58 y.o. female with the past medical history of chronic pain in the back, history of breast cancer, status post mastectomy, who apparently was in her usual state of health when she had gone over to [place] to eat. When she was coming back with her boyfriend, apparently she became confused and started having jerking movements. These  were concerning for seizure type activity. EMS was called and the patient was brought into the hospital. Patient still was confused during the initial assessment. She denied any headaches or any other pain at this time. She reported feeling funny earlier, but was unable to specify any further. She doesn't have any known history of seizures. Denied any recent trauma. No fever. No nausea, vomiting, or diarrhea. No recent medication changes. Denied stopping any of her medications abruptly. CT scan of the head on admission revealed no acute findings.      Hospital Course:   1. Acute encephalopathy, possibly secondary to post ictal delirium for new onset seizure.  The patient was started on supportive treatment. Antiepileptic medication was withheld pending further investigation and consultation with neurologist Dr. Merlene Laughter. She was placed on seizure precautions. Her chronic medications including methadone and when necessary  Xanax were continued. There was a strong suspicion of benzodiazepine withdrawal seizure . Her urine drug screen was negative for both opiates and benzodiazepines, however the patient denied missing any doses of Xanax or methadone. For further evaluation, a number of studies were ordered. Her troponin I was within normal limits. Her venous glucose was within normal limits. Her TSH was within normal limits. Dr. Merlene Laughter, neurologist, evaluated the patient. Prior to the results of the MRI of the brain and EEG, he believed that the patient's symptomatology was consistent with a seizure, likely from benzodiazepine withdrawal. He did not endorse starting antiepileptic medications. Her MRI of the brain revealed no acute intracranial abnormalities. Her EEG was essentially normal. The patient had no further seizure-like activity or delirium/encephalopathy during the hospitalization. She was discharged with instructions to avoid stopping  any of her medications, particularly Xanax and methadone,  abruptly and to follow the instructions of pain physician.   2. Benzodiazepine and methadone dependence, secondary to chronic anxiety and chronic pain syndrome. Query abuse of Xanax. The patient was restarted on as needed Xanax and methadone as previously prescribed. Although she denied abruptly stopping either of these 2 medications, her urine drug screen was negative for both. (Of note, I do not believe the urine drug screen picks up methadone). Her pain physician, Dr. Sander Radon called me because he was concerned that the patient may have overdosed. I informed him that she did not and that her admission was likely the cause of benzodiazepine withdrawal seizure. He informed me that an acquaintance of the patient had called him with a concern that she had been taking her Xanax inappropriately, i.e., she would take the whole month supply of Xanax and finish them within a couple weeks. This is just a report, but it would be consistent with the patient taking Xanax, finishing it too early before the next prescription, and thus making her susceptible to benzodiazepine seizures. Dr. Larena Sox stated that his plan is to wean her off of both medications. At the time of discharge, he did agree with decreasing the methadone to 5 mg every 12 hours rather than 10 mg every 8 hours, in part, because of severe bradycardia.  3. Bradycardia. The patient heart rate, at times, felt to the upper 30s, this particularly happens when she was asleep. For the most part, her heart rate ranged from 50-65 when she was awake. Her EKG revealed sinus bradycardia, incomplete right bundle branch block, and a heart rate of 51 beats per minute. Her QT/QTC was 498/458. These findings were thought to be secondary to methadone which can cause bradycardia and QT prolongation. For this reason, the dosing of methadone was decreased to 5 mg every 8 hours and then subsequently decreased to 5 mg every 12 hours upon discharge. For further evaluation, her  thyroid function and a 2-D echocardiogram were ordered. Her TSH was within normal limits. 2-D echocardiogram results are pending at the time of discharge. She had borderline hypokalemia. This was supplemented and repleted. Her heart weight was 60 at the time of discharge.  4. Leukocytosis, unspecified.  The patient's white blood cell count ranged from 10.7 on admission to 16.8. It was 14.8 at time of discharge. She remained afebrile. Her chest x-ray revealed no evidence of pneumonia. Her urinalysis was not consistent with an infection. It could have been secondary to demargination from the seizure.     Procedures:  12/08/2013. EEG. Results revealed a normal recording.  12/09/2013. 2-D echocardiogram: Results pending at the time of discharge.  Consultations:  Neurologist, Dr. Merlene Laughter  Discharge Exam: Filed Vitals:   12/09/13 1450  BP:   Pulse: 60  Temp:   Resp:    Temperature 98.8. Pulse 60. Blood pressure 131/98. Respiratory rate 16. Oxygen saturation 100% on room air.  General: Alert 58 year old Caucasian woman sitting up in bed, in no acute distress.  Cardiovascular: S1, S2, with bradycardia and a soft systolic murmur.  Respiratory: Clear to auscultation bilaterally. Neuropsychiatric: She is a pleasant affect. She is alert and oriented x3. Cranial nerves II through XII are intact. Her gait is within normal limits.  Discharge Instructions  Discharge Orders   Future Orders Complete By Expires   Diet - low sodium heart healthy  As directed    Discharge instructions  As directed    Comments:  THE DOSE OF METHADONE HAS BEEN REDUCED TO A HALF A TABLET EVERY 12 HOURS AS DISCUSSED WITH DR.WINIKUR. DR. Caryn Section WILL CALL YOU WITH THE RESULTS OF THE 2-D ECHOCARDIOGRAM IF IT IS ABNORMAL.   Driving Restrictions  As directed    Comments:     DO NOT DRIVE UNTIL YOU ARE CLEARED BY YOUR PRIMARY CARE PHYSICIAN OR PRIMARY PAIN MEDICINE PHYSICIAN.   Increase activity slowly  As directed         Medication List         ALPRAZolam 0.5 MG tablet  Commonly known as:  XANAX  Take 0.5 mg by mouth 2 (two) times daily as needed.     furosemide 20 MG tablet  Commonly known as:  LASIX  Take 10 mg by mouth 3 (three) times daily.     methadone 10 MG tablet  Commonly known as:  DOLOPHINE  Take 0.5 tablets (5 mg total) by mouth every 12 (twelve) hours.     potassium chloride 10 MEQ tablet  Commonly known as:  K-DUR  Take 2 tablets (20 mEq total) by mouth daily.     tamoxifen 20 MG tablet  Commonly known as:  NOLVADEX  Take 1 tablet (20 mg total) by mouth daily.     zolpidem 10 MG tablet  Commonly known as:  AMBIEN  Take 10 mg by mouth at bedtime as needed.       No Known Allergies    The results of significant diagnostics from this hospitalization (including imaging, microbiology, ancillary and laboratory) are listed below for reference.    Significant Diagnostic Studies: Ct Head Wo Contrast  12/07/2013   CLINICAL DATA:  Confusion.  Questionable seizure activity.  EXAM: CT HEAD WITHOUT CONTRAST  TECHNIQUE: Contiguous axial images were obtained from the base of the skull through the vertex without intravenous contrast.  COMPARISON:  None.  FINDINGS: Slight motion degradation.  No intracranial hemorrhage.  No CT evidence of large acute infarct.  No intracranial mass lesion noted on this unenhanced exam. If further delineation for seizure focus or small acute infarct is clinically desired, MR may be considered  No hydrocephalus.  IMPRESSION: No intracranial hemorrhage or CT evidence of large acute infarct.   Electronically Signed   By: Chauncey Cruel M.D.   On: 12/07/2013 18:36   Mr Brain Wo Contrast  12/08/2013   CLINICAL DATA:  58 year old female with syncope and seizure. "Jerking" episode yesterday. Initial encounter. History of breast cancer.  EXAM: MRI HEAD WITHOUT CONTRAST  TECHNIQUE: Multiplanar, multiecho pulse sequences of the brain and surrounding structures were  obtained without intravenous contrast.  COMPARISON:  Head CT without contrast 12/07/2013.  FINDINGS: The examination had to be discontinued prior to completion due to patient request.  Cerebral volume is within normal limits for age. No strong evidence of restricted diffusion. Increased trace diffusion signal at the central midbrain is felt to be artifact. No definite acute infarction. . No midline shift, mass effect, evidence of mass lesion, ventriculomegaly, extra-axial collection or acute intracranial hemorrhage. Cervicomedullary junction and pituitary are within normal limits. Negative visualized cervical spine. Major intracranial vascular flow voids are preserved.  Overall normal for age gray and white matter signal throughout the brain. No cortical encephalomalacia identified. No contrast administered.  Heterogeneous bone marrow signal, but no destructive or focal suspicious osseous lesion identified.  Visualized orbit soft tissues are within normal limits. Minor paranasal sinus mucosal thickening. Mastoids are clear. Visualized scalp soft tissues are within normal limits.  IMPRESSION:  No acute intracranial abnormality. The examination had to be discontinued prior to completion.   Electronically Signed   By: Lars Pinks M.D.   On: 12/08/2013 11:37   Dg Chest Port 1 View  12/08/2013   CLINICAL DATA:  Leukocytosis  EXAM: PORTABLE CHEST - 1 VIEW  COMPARISON:  None.  FINDINGS: The heart size and mediastinal contours are within normal limits. Both lungs are clear. The visualized skeletal structures are unremarkable.  IMPRESSION: No active disease.   Electronically Signed   By: Kathreen Devoid   On: 12/08/2013 10:20    Microbiology: No results found for this or any previous visit (from the past 240 hour(s)).   Labs: Basic Metabolic Panel:  Recent Labs Lab 12/07/13 1812 12/08/13 0135 12/08/13 0510 12/09/13 0439  NA 142  --  144 143  K 3.7  --  3.4* 3.5*  CL 102  --  105 105  CO2 22  --  23 25  GLUCOSE  109*  --  82 89  BUN 12  --  9 10  CREATININE 0.77  --  0.71 0.87  CALCIUM 9.8  --  9.3 9.1  MG  --  2.2  --   --    Liver Function Tests:  Recent Labs Lab 12/08/13 0135 12/08/13 0510  AST 22 17  ALT 21 19  ALKPHOS 107 99  BILITOT 0.7 0.8  PROT 8.0 7.5  ALBUMIN 4.3 3.9   No results found for this basename: LIPASE, AMYLASE,  in the last 168 hours No results found for this basename: AMMONIA,  in the last 168 hours CBC:  Recent Labs Lab 12/07/13 1812 12/08/13 0510 12/09/13 0439  WBC 10.7* 16.8* 14.8*  NEUTROABS 7.8*  --   --   HGB 14.8 14.9 14.9  HCT 44.6 44.5 44.0  MCV 83.5 83.8 84.0  PLT 380 380 326   Cardiac Enzymes:  Recent Labs Lab 12/07/13 1812  TROPONINI <0.30   BNP: BNP (last 3 results) No results found for this basename: PROBNP,  in the last 8760 hours CBG: No results found for this basename: GLUCAP,  in the last 168 hours     Signed:  Meg Niemeier  [facility] 12/09/2013, 4:49 PM

## 2013-12-09 NOTE — Evaluation (Signed)
Occupational Therapy Evaluation Patient Details Name: Jamie Nunez MRN: [id number] DOB: [birth year] Today's Date: 12/09/2013 Time: 1696-7893 OT Time Calculation (min): 35 min Evaluation 39'  OT Assessment / Plan / Recommendation History of present illness Jamie Nunez is a 58 y.o. female with the past medical history of chronic pain in the back, history of breast cancer, status post mastectomy, who apparently was in her usual state of health earlier today when she had gone over to [place] to eat. When she was coming back with her boyfriend, apparently patient got confused and started having had jerking movements. These were concerning for seizure type activity. EMS was called and the patient was brought into the hospital.  Her confusion has cleared ans she states feeling no different than normal.   Clinical Impression   Pt is a 58 yo female who lives with her fiance' and was independent with all ADLs and IADLs prior to recent episode and admission. Pt is currently presenting at independent level with ADLs assessed and states no concerns about ADL tasks after returning home. Pt does not currently require further OT services.     OT Assessment  Patient does not need any further OT services    Follow Up Recommendations  No OT follow up       Equipment Recommendations  None recommended by OT          Precautions / Restrictions Precautions Precautions: None Restrictions Weight Bearing Restrictions: No   Pertinent Vitals/Pain O2 88-99 HR in the 50s    ADL  Eating/Feeding: Independent Where Assessed - Eating/Feeding: Bed level Grooming: Wash/dry hands;Teeth care;Brushing hair;Independent Where Assessed - Grooming: Unsupported standing Toilet Transfer: Independent Toilet Transfer Method: Sit to Loss adjuster, chartered: Regular height toilet ADL Comments: Pt was independent in all ADLs and IADLs prior to admission. Pt is currently presenting at an independent  level in tasks assessed.      OT Goals Acute Rehab OT Goals Patient Stated Goal: to get back home OT Goal Formulation: With patient Time For Goal Achievement: 12/23/13 Potential to Achieve Goals: Good  Visit Information  Last OT Received On: 12/09/13 History of Present Illness: Jamie Nunez is a 58 y.o. female with the past medical history of chronic pain in the back, history of breast cancer, status post mastectomy, who apparently was in her usual state of health earlier today when she had gone over to [place] to eat. When she was coming back with her boyfriend, apparently patient got confused and started having had jerking movements. These were concerning for seizure type activity. EMS was called and the patient was brought into the hospital.  Her confusion has cleared ans she states feeling no different than normal.       Prior [place] expects to be discharged to:: Private residence Living Arrangements: Spouse/significant other Available Help at Discharge: Family Type of Home: House Home Layout: Multi-level;Able to live on main level with bedroom/bathroom Home Equipment: Grab bars - toilet;Grab bars - tub/shower Additional Comments: Pt indicated they ensured the house was handicap ready prior to moving in Prior Function Level of Independence: Independent Communication Communication: No difficulties Dominant Hand: Left     Vision/Perception Vision - History Baseline Vision: Wears glasses only for reading Patient Visual Report: No change from baseline   Cognition  Cognition Arousal/Alertness: Awake/alert Behavior During Therapy: WFL for tasks assessed/performed Overall Cognitive Status: Within Functional Limits for tasks assessed    Extremity/Trunk Assessment Upper Extremity Assessment  Upper Extremity Assessment: Overall WFL for tasks assessed Lower Extremity Assessment Lower Extremity Assessment: Defer to PT evaluation      Mobility Bed Mobility Overal bed mobility: Independent Transfers Overall transfer level: Independent Equipment used: None      Balance Balance Overall balance assessment: No apparent balance deficits (not formally assessed)   End of Session OT - End of Session Activity Tolerance: Patient tolerated treatment well Patient left: in bed;with call bell/phone within reach;with nursing/sitter in room;with family/visitor present  GO Functional Assessment Tool Used: Clinical Observation Functional Limitation: Self care Self Care Current Status (L5449): 0 percent impaired, limited or restricted Self Care Goal Status (E0100): 0 percent impaired, limited or restricted Self Care Discharge Status (F1219): 0 percent impaired, limited or restricted    Bea Graff, MS, OTR/L [phone]  12/09/2013, 9:22 AM

## 2013-12-09 NOTE — Plan of Care (Signed)
Problem: Phase I Progression Outcomes Goal: Pain controlled with appropriate interventions Outcome: Adequate for Discharge Patients pain medications held for hospital stay, heat packs applied for back pain as needed

## 2013-12-09 NOTE — Procedures (Signed)
  [place] A. Merlene Laughter, MD     [email or web address]           HISTORY: The patient is a 58 year old female who presents with confusion and altered mental status associated with shaking suspicious for seizure activity.  MEDICATIONS: Scheduled Meds: . enoxaparin (LOVENOX) injection  40 mg Subcutaneous Q24H  . furosemide  10 mg Oral BID  . influenza vac split quadrivalent PF  0.5 mL Intramuscular Tomorrow-1000  . methadone  10 mg Oral Q8H  . sodium chloride  3 mL Intravenous Q12H  . tamoxifen  20 mg Oral Daily  . thiamine  100 mg Intravenous Daily   Continuous Infusions:  PRN Meds:.acetaminophen, acetaminophen, albuterol, ALPRAZolam, ondansetron (ZOFRAN) IV, ondansetron, zolpidem   ANALYSIS: 16 channel recording using standard 10 20 measurements Approximately 20 minutes. There is a well-formed posterior dominant rhythm of 8-1/2 Hz which attenuates with eye opening. There is beta activity observed in the frontal areas. Awake and sleep activities are recorded. K complexes and sleep spindles are observed. Photic summation and hyperventilation were not carried out. There is no focal or lateralized slowing. There are no epileptiform activities observed.   IMPRESSION: This is a normal recording of the awake and sleep states.      Farrah Skoda A. Merlene Laughter, M.D.  Diplomate, Tax adviser of Psychiatry and Neurology ( Neurology).

## 2013-12-09 NOTE — Progress Notes (Signed)
*  PRELIMINARY RESULTS* Echocardiogram 2D Echocardiogram has been performed.  Nunez, Jamie 12/09/2013, 4:37 PM

## 2013-12-09 NOTE — Progress Notes (Signed)
Patient ID: Jamie Nunez, female   DOB: 1955-12-30, 58 y.o.   MRN: [id number]  [place] A. Merlene Laughter, MD     [email or web address]          Jamie Nunez is an 58 y.o. female.   Assessment/Plan: 1. The semiology of the patient's spell seems most consistent with a seizure. She has a negative EEG and MRI and therefore has a low risk of recurrence. 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 Her toxicology results are negative for benzodiazepines which should be positive for 3 days after the last dose. There is also some concerns with the patient taking methadone and Benzodiazepine. This increases risk of overdose and potential cardiac risk. I believe the patient's pain physician along with Dr. Caryn Section while working to wean off the Xanax which I agree with. Given that she likely has a low risk of recurrence, I see no need in adding a seizure medication at this time. I will sign off reconsult as needed.  No new complaints are reported. She is doing well.  GENERAL: This is very pleasant female in no acute distress.  HEENT: Supple. Atraumatic normocephalic.  ABDOMEN: soft  EXTREMITIES: No edema  BACK: Normal.  SKIN: Normal by inspection.  MENTAL STATUS: Alert and oriented. Speech, language and cognition are generally intact. Judgment and insight normal.  CRANIAL NERVES: Pupils are equal, round and reactive to light and accommodation; extra ocular movements are full, there is no significant nystagmus; visual fields are full; upper and lower facial muscles are normal in strength and symmetric, there is no flattening of the nasolabial folds; tongue is midline; uvula is midline; shoulder elevation is normal.  MOTOR: Normal tone, bulk and strength; no pronator drift.  COORDINATION: Left finger to nose is normal, right finger to nose is normal, No rest tremor; no intention tremor; no postural tremor; no bradykinesia.  REFLEXES: Deep  tendon reflexes are symmetrical and normal. Babinski reflexes are flexor bilaterally.  SENSATION: Normal to light touch.        Objective: Vital signs in last 24 hours: Temp:  [98.2 F (36.8 C)-99 F (37.2 C)] 98.8 F (37.1 C) (02/05 0730) Pulse Rate:  [37-73] 49 (02/05 0600) Resp:  [11-21] 12 (02/05 0600) BP: (110-134)/(57-86) 124/59 mmHg (02/05 0600) SpO2:  [74 %-99 %] 94 % (02/05 0600) Weight:  [86.2 kg (190 lb 0.6 oz)] 86.2 kg (190 lb 0.6 oz) (02/05 0500)  Intake/Output from previous day: 02/04 0701 - 02/05 0700 In: 678 [I.V.:675] Out: 725 [Urine:725] Intake/Output this shift:   Nutritional status: General   Lab Results: Results for orders placed during the hospital encounter of 12/07/13 (from the past 48 hour(s))  CBC WITH DIFFERENTIAL     Status: Abnormal   Collection Time    12/07/13  6:12 PM      Result Value Range   WBC 10.7 (*) 4.0 - 10.5 K/uL   RBC 5.34 (*) 3.87 - 5.11 MIL/uL   Hemoglobin 14.8  12.0 - 15.0 g/dL   HCT 44.6  36.0 - 46.0 %   MCV 83.5  78.0 - 100.0 fL   MCH 27.7  26.0 - 34.0 pg   MCHC 33.2  30.0 - 36.0 g/dL   RDW 14.8  11.5 - 15.5 %   Platelets 380  150 - 400 K/uL   Neutrophils Relative % 73  43 - 77 %   Neutro Abs 7.8 (*) 1.7 - 7.7 K/uL   Lymphocytes Relative 19  12 - 46 %   Lymphs Abs 2.1  0.7 - 4.0 K/uL   Monocytes Relative 7  3 - 12 %   Monocytes Absolute 0.8  0.1 - 1.0 K/uL   Eosinophils Relative 0  0 - 5 %   Eosinophils Absolute 0.0  0.0 - 0.7 K/uL   Basophils Relative 0  0 - 1 %   Basophils Absolute 0.0  0.0 - 0.1 K/uL  BASIC METABOLIC PANEL     Status: Abnormal   Collection Time    12/07/13  6:12 PM      Result Value Range   Sodium 142  137 - 147 mEq/L   Potassium 3.7  3.7 - 5.3 mEq/L   Chloride 102  96 - 112 mEq/L   CO2 22  19 - 32 mEq/L   Glucose, Bld 109 (*) 70 - 99 mg/dL   BUN 12  6 - 23 mg/dL   Creatinine, Ser 0.77  0.50 - 1.10 mg/dL   Calcium 9.8  8.4 - 10.5 mg/dL   GFR calc non Af Amer >90  >90 mL/min   GFR calc  Af Amer >90  >90 mL/min   Comment: (NOTE)     The eGFR has been calculated using the CKD EPI equation.     This calculation has not been validated in all clinical situations.     eGFR's persistently <90 mL/min signify possible Chronic Kidney     Disease.  TROPONIN I     Status: None   Collection Time    12/07/13  6:12 PM      Result Value Range   Troponin I <0.30  <0.30 ng/mL   Comment:            Due to the release kinetics of cTnI,     a negative result within the first hours     of the onset of symptoms does not rule out     myocardial infarction with certainty.     If myocardial infarction is still suspected,     repeat the test at appropriate intervals.  URINALYSIS, ROUTINE W REFLEX MICROSCOPIC     Status: Abnormal   Collection Time    12/07/13  7:40 PM      Result Value Range   Color, Urine YELLOW  YELLOW   APPearance CLEAR  CLEAR   Specific Gravity, Urine 1.025  1.005 - 1.030   pH 6.0  5.0 - 8.0   Glucose, UA NEGATIVE  NEGATIVE mg/dL   Hgb urine dipstick NEGATIVE  NEGATIVE   Bilirubin Urine NEGATIVE  NEGATIVE   Ketones, ur 15 (*) NEGATIVE mg/dL   Protein, ur NEGATIVE  NEGATIVE mg/dL   Urobilinogen, UA 1.0  0.0 - 1.0 mg/dL   Nitrite NEGATIVE  NEGATIVE   Leukocytes, UA NEGATIVE  NEGATIVE   Comment: MICROSCOPIC NOT DONE ON URINES WITH NEGATIVE PROTEIN, BLOOD, LEUKOCYTES, NITRITE, OR GLUCOSE <1000 mg/dL.  URINE RAPID DRUG SCREEN (HOSP PERFORMED)     Status: None   Collection Time    12/07/13  9:07 PM      Result Value Range   Opiates NONE DETECTED  NONE DETECTED   Cocaine NONE DETECTED  NONE DETECTED   Benzodiazepines NONE DETECTED  NONE DETECTED   Amphetamines NONE DETECTED  NONE DETECTED   Tetrahydrocannabinol NONE DETECTED  NONE DETECTED   Barbiturates NONE DETECTED  NONE DETECTED   Comment:            DRUG SCREEN FOR MEDICAL PURPOSES  ONLY.  IF CONFIRMATION IS NEEDED     FOR ANY PURPOSE, NOTIFY LAB     WITHIN 5 DAYS.                LOWEST DETECTABLE LIMITS      FOR URINE DRUG SCREEN     Drug Class       Cutoff (ng/mL)     Amphetamine      1000     Barbiturate      200     Benzodiazepine   193     Tricyclics       790     Opiates          300     Cocaine          300     THC              50  MAGNESIUM     Status: None   Collection Time    12/08/13  1:35 AM      Result Value Range   Magnesium 2.2  1.5 - 2.5 mg/dL  HEPATIC FUNCTION PANEL     Status: None   Collection Time    12/08/13  1:35 AM      Result Value Range   Total Protein 8.0  6.0 - 8.3 g/dL   Albumin 4.3  3.5 - 5.2 g/dL   AST 22  0 - 37 U/L   ALT 21  0 - 35 U/L   Alkaline Phosphatase 107  39 - 117 U/L   Total Bilirubin 0.7  0.3 - 1.2 mg/dL   Bilirubin, Direct <0.2  0.0 - 0.3 mg/dL   Indirect Bilirubin NOT CALCULATED  0.3 - 0.9 mg/dL  TSH     Status: None   Collection Time    12/08/13  5:10 AM      Result Value Range   TSH 3.611  0.350 - 4.500 uIU/mL   Comment: Performed at [facility]  COMPREHENSIVE METABOLIC PANEL     Status: Abnormal   Collection Time    12/08/13  5:10 AM      Result Value Range   Sodium 144  137 - 147 mEq/L   Potassium 3.4 (*) 3.7 - 5.3 mEq/L   Chloride 105  96 - 112 mEq/L   CO2 23  19 - 32 mEq/L   Glucose, Bld 82  70 - 99 mg/dL   BUN 9  6 - 23 mg/dL   Creatinine, Ser 0.71  0.50 - 1.10 mg/dL   Calcium 9.3  8.4 - 10.5 mg/dL   Total Protein 7.5  6.0 - 8.3 g/dL   Albumin 3.9  3.5 - 5.2 g/dL   AST 17  0 - 37 U/L   ALT 19  0 - 35 U/L   Alkaline Phosphatase 99  39 - 117 U/L   Total Bilirubin 0.8  0.3 - 1.2 mg/dL   GFR calc non Af Amer >90  >90 mL/min   GFR calc Af Amer >90  >90 mL/min   Comment: (NOTE)     The eGFR has been calculated using the CKD EPI equation.     This calculation has not been validated in all clinical situations.     eGFR's persistently <90 mL/min signify possible Chronic Kidney     Disease.  CBC     Status: Abnormal   Collection Time    12/08/13  5:10 AM      Result Value Range  WBC 16.8 (*) 4.0 - 10.5  K/uL   RBC 5.31 (*) 3.87 - 5.11 MIL/uL   Hemoglobin 14.9  12.0 - 15.0 g/dL   HCT 44.5  36.0 - 46.0 %   MCV 83.8  78.0 - 100.0 fL   MCH 28.1  26.0 - 34.0 pg   MCHC 33.5  30.0 - 36.0 g/dL   RDW 14.9  11.5 - 15.5 %   Platelets 380  150 - 400 K/uL  CBC     Status: Abnormal   Collection Time    12/09/13  4:39 AM      Result Value Range   WBC 14.8 (*) 4.0 - 10.5 K/uL   RBC 5.24 (*) 3.87 - 5.11 MIL/uL   Hemoglobin 14.9  12.0 - 15.0 g/dL   HCT 44.0  36.0 - 46.0 %   MCV 84.0  78.0 - 100.0 fL   MCH 28.4  26.0 - 34.0 pg   MCHC 33.9  30.0 - 36.0 g/dL   RDW 14.8  11.5 - 15.5 %   Platelets 326  150 - 400 K/uL  BASIC METABOLIC PANEL     Status: Abnormal   Collection Time    12/09/13  4:39 AM      Result Value Range   Sodium 143  137 - 147 mEq/L   Potassium 3.5 (*) 3.7 - 5.3 mEq/L   Chloride 105  96 - 112 mEq/L   CO2 25  19 - 32 mEq/L   Glucose, Bld 89  70 - 99 mg/dL   BUN 10  6 - 23 mg/dL   Creatinine, Ser 0.87  0.50 - 1.10 mg/dL   Calcium 9.1  8.4 - 10.5 mg/dL   GFR calc non Af Amer 72 (*) >90 mL/min   GFR calc Af Amer 83 (*) >90 mL/min   Comment: (NOTE)     The eGFR has been calculated using the CKD EPI equation.     This calculation has not been validated in all clinical situations.     eGFR's persistently <90 mL/min signify possible Chronic Kidney     Disease.    Lipid Panel No results found for this basename: CHOL, TRIG, HDL, CHOLHDL, VLDL, LDLCALC,  in the last 72 hours  Studies/Results: Ct Head Wo Contrast  12/07/2013   CLINICAL DATA:  Confusion.  Questionable seizure activity.  EXAM: CT HEAD WITHOUT CONTRAST  TECHNIQUE: Contiguous axial images were obtained from the base of the skull through the vertex without intravenous contrast.  COMPARISON:  None.  FINDINGS: Slight motion degradation.  No intracranial hemorrhage.  No CT evidence of large acute infarct.  No intracranial mass lesion noted on this unenhanced exam. If further delineation for seizure focus or small acute  infarct is clinically desired, MR may be considered  No hydrocephalus.  IMPRESSION: No intracranial hemorrhage or CT evidence of large acute infarct.   Electronically Signed   By: Chauncey Cruel M.D.   On: 12/07/2013 18:36   Mr Brain Wo Contrast  12/08/2013   CLINICAL DATA:  58 year old female with syncope and seizure. "Jerking" episode yesterday. Initial encounter. History of breast cancer.  EXAM: MRI HEAD WITHOUT CONTRAST  TECHNIQUE: Multiplanar, multiecho pulse sequences of the brain and surrounding structures were obtained without intravenous contrast.  COMPARISON:  Head CT without contrast 12/07/2013.  FINDINGS: The examination had to be discontinued prior to completion due to patient request.  Cerebral volume is within normal limits for age. No strong evidence of restricted diffusion. Increased trace diffusion signal  at the central midbrain is felt to be artifact. No definite acute infarction. . No midline shift, mass effect, evidence of mass lesion, ventriculomegaly, extra-axial collection or acute intracranial hemorrhage. Cervicomedullary junction and pituitary are within normal limits. Negative visualized cervical spine. Major intracranial vascular flow voids are preserved.  Overall normal for age gray and white matter signal throughout the brain. No cortical encephalomalacia identified. No contrast administered.  Heterogeneous bone marrow signal, but no destructive or focal suspicious osseous lesion identified.  Visualized orbit soft tissues are within normal limits. Minor paranasal sinus mucosal thickening. Mastoids are clear. Visualized scalp soft tissues are within normal limits.  IMPRESSION: No acute intracranial abnormality. The examination had to be discontinued prior to completion.   Electronically Signed   By: Lars Pinks M.D.   On: 12/08/2013 11:37   Dg Chest Port 1 View  12/08/2013   CLINICAL DATA:  Leukocytosis  EXAM: PORTABLE CHEST - 1 VIEW  COMPARISON:  None.  FINDINGS: The heart size and  mediastinal contours are within normal limits. Both lungs are clear. The visualized skeletal structures are unremarkable.  IMPRESSION: No active disease.   Electronically Signed   By: Kathreen Devoid   On: 12/08/2013 10:20    Medications:  Scheduled Meds: . enoxaparin (LOVENOX) injection  40 mg Subcutaneous Q24H  . furosemide  10 mg Oral BID  . influenza vac split quadrivalent PF  0.5 mL Intramuscular Tomorrow-1000  . methadone  10 mg Oral Q8H  . sodium chloride  3 mL Intravenous Q12H  . tamoxifen  20 mg Oral Daily  . thiamine  100 mg Intravenous Daily   Continuous Infusions:  PRN Meds:.acetaminophen, acetaminophen, albuterol, ALPRAZolam, ondansetron (ZOFRAN) IV, ondansetron, zolpidem     LOS: 2 days   Kalynne Womac A. Merlene Laughter, M.D.  Diplomate, [employer] adviser of Psychiatry and Neurology ( Neurology).

## 2013-12-09 NOTE — Progress Notes (Signed)
Patient discharged home.  IV removed - WNL.  Patient educated on changes to medications and how to take them.  Patient states she will call Dr. Sander Radon for f/u in 5-7 days.  PAtient verbalizes understanding of DC instructions.  No questions at this time.  Stable to DC home.  Waiting on fiance to pick up.

## 2013-12-09 NOTE — Progress Notes (Signed)
Pt has been asymptomatic bradycardia all night, have went in room to wake pt up when monitor reading 38, her heart rate would go up to high 50's to 61 while talking sometimes but most times hr would be in the 50's while fully awake in a conversation long enough for me to assess.  i was told in report that pt is bradycardic but was not told how low she went.  Pt states she is not aware of ever having cardiac issues prior.  I discussed my plan for holding her 6A dose of methadone until the MD rounds on her this morning and she agreed.  I told her that at anytime she felt she couldn't wait on the MD to let her oncoming nurse know and they would consult with charge nurse or MD about going ahead and giving it to her if they felt it was appropriate.  Pt had said last night that she wanted me to bring her a prn xanax with that 6A methadone and I explained that I didn't feel comfortable giving her that med either and she was appreciative and understanding of that at this time.  Also, while talking with pt she told me that she takes a cancer drug daily and she hasn't had it since she got here and in fact forgot to take it the day of her "new onset seizure" so the last day she took this med was on 12/06/13.  She couldn't remember the name of the drug at first but then remembered it was Tamoxifen 20mg . I went to her home med list on the navigators to see if she did actually tell anyone she took the med so it could be ordered here and it was not on her home med requisition.   I text paged the MD to let him know my plan of holding the methadone and why and also about the need for the tamoxifen to be ordered.  Will transition all this to the oncoming receiving nurse, and will cont to monitor

## 2013-12-09 NOTE — Progress Notes (Signed)
PT Cancellation Note  Patient Details Name: Jamie Nunez MRN: [id number] DOB: 1956-05-26   Cancelled Treatment:    Reason Eval/Treat Not Completed: PT screened, no needs identified, will sign off Pt is up in room, ambulating independently.  She reports having no mobility issues other than severe OA of the left knee for which she needs surgery.  She is not interested in using any kind of assistive device to make walking less painful.  Demetrios Isaacs L 12/09/2013, 12:08 PM

## 2013-12-10 ENCOUNTER — Encounter (HOSPITAL_COMMUNITY): Payer: Self-pay | Admitting: Internal Medicine

## 2014-11-11 IMAGING — CR DG CHEST 1V PORT
1 series · 1 of 1 positions shown · non-contrast
Comparison: None.

CLINICAL DATA: Leukocytosis

EXAM:
PORTABLE CHEST - 1 VIEW

[portable]
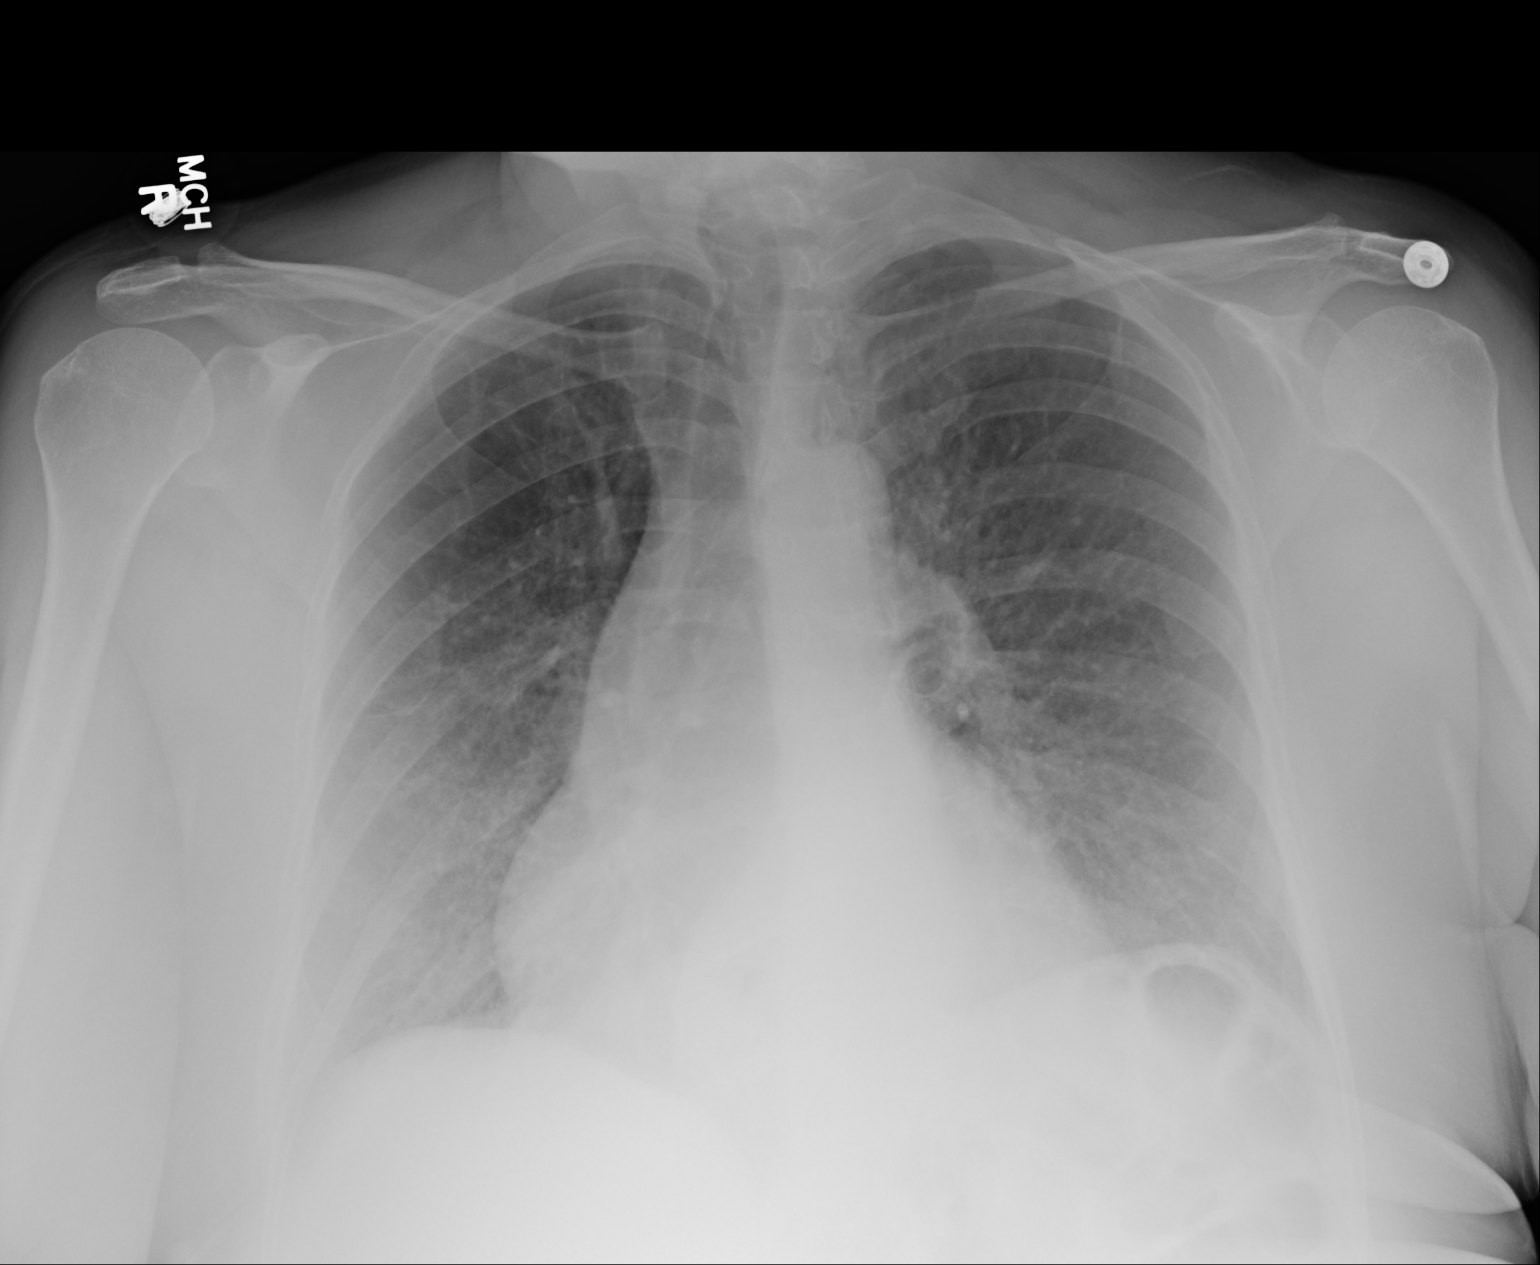

[1 of 1 positions shown; findings below may reference images not displayed]

FINDINGS: The heart size and mediastinal contours are within normal limits.
Both lungs are clear. The visualized skeletal structures are
unremarkable.
IMPRESSION: No active disease.
